# Patient Record
Sex: Male | Born: 1935 | Race: Black or African American | Hispanic: No | Marital: Married
Health system: Southern US, Community
[De-identification: ages and names within clinical notes are randomized; demographics above are authoritative.]

---

## 2004-10-19 ENCOUNTER — Emergency Department: Payer: Self-pay | Admitting: Internal Medicine

## 2004-11-14 ENCOUNTER — Emergency Department: Payer: Self-pay | Admitting: General Practice

## 2006-09-07 ENCOUNTER — Emergency Department: Payer: Self-pay | Admitting: Emergency Medicine

## 2007-05-30 ENCOUNTER — Emergency Department: Payer: Self-pay | Admitting: Emergency Medicine

## 2007-08-20 ENCOUNTER — Other Ambulatory Visit: Payer: Self-pay

## 2007-08-20 ENCOUNTER — Inpatient Hospital Stay: Payer: Self-pay | Admitting: Internal Medicine

## 2007-08-31 ENCOUNTER — Ambulatory Visit: Payer: Self-pay | Admitting: Internal Medicine

## 2012-04-03 ENCOUNTER — Emergency Department: Payer: Self-pay | Admitting: Unknown Physician Specialty

## 2012-04-03 LAB — COMPREHENSIVE METABOLIC PANEL
Albumin: 3.7 g/dL (ref 3.4–5.0)
Anion Gap: 8 (ref 7–16)
BUN: 16 mg/dL (ref 7–18)
Bilirubin,Total: 0.3 mg/dL (ref 0.2–1.0)
Co2: 28 mmol/L (ref 21–32)
Creatinine: 1.25 mg/dL (ref 0.60–1.30)
Potassium: 4.8 mmol/L (ref 3.5–5.1)
SGOT(AST): 31 U/L (ref 15–37)
SGPT (ALT): 48 U/L (ref 12–78)
Sodium: 142 mmol/L (ref 136–145)
Total Protein: 7.5 g/dL (ref 6.4–8.2)

## 2012-04-03 LAB — URINALYSIS, COMPLETE
Bilirubin,UR: NEGATIVE
Ketone: NEGATIVE
Ph: 6 (ref 4.5–8.0)
Protein: 150
RBC,UR: 170 /HPF (ref 0–5)
Specific Gravity: 1.025 (ref 1.003–1.030)
Squamous Epithelial: 5

## 2012-04-03 LAB — CBC
HCT: 37.2 % — ABNORMAL LOW (ref 40.0–52.0)
HGB: 12.3 g/dL — ABNORMAL LOW (ref 13.0–18.0)
MCHC: 32.9 g/dL (ref 32.0–36.0)
Platelet: 125 10*3/uL — ABNORMAL LOW (ref 150–440)
RDW: 17.1 % — ABNORMAL HIGH (ref 11.5–14.5)

## 2012-04-08 ENCOUNTER — Emergency Department: Payer: Self-pay | Admitting: Emergency Medicine

## 2012-04-08 LAB — URINALYSIS, COMPLETE
Ketone: NEGATIVE
Leukocyte Esterase: NEGATIVE
Nitrite: NEGATIVE
Ph: 5 (ref 4.5–8.0)
Protein: 30
RBC,UR: 1 /HPF (ref 0–5)
Squamous Epithelial: NONE SEEN
WBC UR: 1 /HPF (ref 0–5)

## 2013-07-23 ENCOUNTER — Emergency Department: Payer: Self-pay | Admitting: Emergency Medicine

## 2013-07-23 LAB — CBC
HCT: 36.9 % — ABNORMAL LOW
HGB: 11.9 g/dL — ABNORMAL LOW
MCH: 25.1 pg — ABNORMAL LOW
MCHC: 32.2 g/dL
MCV: 78 fL — ABNORMAL LOW
Platelet: 133 10*3/uL — ABNORMAL LOW
RBC: 4.73 x10 6/mm 3
RDW: 16.1 % — ABNORMAL HIGH
WBC: 5.5 10*3/uL

## 2013-07-23 LAB — COMPREHENSIVE METABOLIC PANEL WITH GFR
Albumin: 3.6 g/dL
Alkaline Phosphatase: 74 U/L
Anion Gap: 2 — ABNORMAL LOW
BUN: 17 mg/dL
Bilirubin,Total: 0.4 mg/dL
Calcium, Total: 8.9 mg/dL
Chloride: 108 mmol/L — ABNORMAL HIGH
Co2: 30 mmol/L
Creatinine: 1.21 mg/dL
EGFR (African American): >60
EGFR (Non-African Amer.): 55 — ABNORMAL LOW
Glucose: 179 mg/dL — ABNORMAL HIGH
Osmolality: 285
Potassium: 4.2 mmol/L
SGOT(AST): 22 U/L
SGPT (ALT): 30 U/L
Sodium: 140 mmol/L
Total Protein: 7.4 g/dL

## 2013-07-23 LAB — TROPONIN I: Troponin-I: <0.02 ng/mL

## 2013-08-07 ENCOUNTER — Ambulatory Visit: Payer: Self-pay | Admitting: Family Medicine

## 2013-08-13 ENCOUNTER — Inpatient Hospital Stay: Payer: Self-pay | Admitting: Family Medicine

## 2013-08-13 LAB — COMPREHENSIVE METABOLIC PANEL
ALT: 37 U/L (ref 12–78)
AST: 28 U/L (ref 15–37)
Albumin: 3.5 g/dL (ref 3.4–5.0)
Alkaline Phosphatase: 77 U/L
Anion Gap: 10 (ref 7–16)
BUN: 20 mg/dL — ABNORMAL HIGH (ref 7–18)
Bilirubin,Total: 0.3 mg/dL (ref 0.2–1.0)
CO2: 20 mmol/L — AB (ref 21–32)
Calcium, Total: 9.2 mg/dL (ref 8.5–10.1)
Chloride: 107 mmol/L (ref 98–107)
Creatinine: 1.14 mg/dL (ref 0.60–1.30)
EGFR (African American): >60
EGFR (Non-African Amer.): 59 — ABNORMAL LOW
Glucose: 184 mg/dL — ABNORMAL HIGH (ref 65–99)
OSMOLALITY: 281 (ref 275–301)
POTASSIUM: 3.9 mmol/L (ref 3.5–5.1)
Sodium: 137 mmol/L (ref 136–145)
TOTAL PROTEIN: 7.7 g/dL (ref 6.4–8.2)

## 2013-08-13 LAB — URINALYSIS, COMPLETE
BACTERIA: NONE SEEN
BILIRUBIN, UR: NEGATIVE
BLOOD: NEGATIVE
Hyaline Cast: 1
LEUKOCYTE ESTERASE: NEGATIVE
Nitrite: NEGATIVE
Ph: 5 (ref 4.5–8.0)
Protein: 100
Specific Gravity: 1.028 (ref 1.003–1.030)
WBC UR: 1 /HPF (ref 0–5)

## 2013-08-13 LAB — DRUG SCREEN, URINE

## 2013-08-13 LAB — CBC WITH DIFFERENTIAL/PLATELET
Basophil #: 0 10*3/uL (ref 0.0–0.1)
Basophil %: 0.4 %
EOS PCT: 0.2 %
Eosinophil #: 0 10*3/uL (ref 0.0–0.7)
HCT: 36.4 % — AB (ref 40.0–52.0)
HGB: 11.4 g/dL — ABNORMAL LOW (ref 13.0–18.0)
Lymphocyte #: 1 10*3/uL (ref 1.0–3.6)
Lymphocyte %: 12.2 %
MCH: 24.4 pg — AB (ref 26.0–34.0)
MCHC: 31.3 g/dL — AB (ref 32.0–36.0)
MCV: 78 fL — ABNORMAL LOW (ref 80–100)
MONO ABS: 0.4 x10 3/mm (ref 0.2–1.0)
MONOS PCT: 4.4 %
NEUTROS ABS: 6.7 10*3/uL — AB (ref 1.4–6.5)
NEUTROS PCT: 82.8 %
Platelet: 156 10*3/uL (ref 150–440)
RBC: 4.66 10*6/uL (ref 4.40–5.90)
RDW: 16.3 % — ABNORMAL HIGH (ref 11.5–14.5)
WBC: 8.1 10*3/uL (ref 3.8–10.6)

## 2013-08-13 LAB — PROTIME-INR
INR: 1.1
Prothrombin Time: 13.8 secs (ref 11.5–14.7)

## 2013-08-13 LAB — APTT: ACTIVATED PTT: 28 s (ref 23.6–35.9)

## 2013-08-13 LAB — ETHANOL
Ethanol %: <0.003 % (ref 0.000–0.080)
Ethanol: <3 mg/dL

## 2013-08-13 LAB — TROPONIN I: Troponin-I: <0.02 ng/mL

## 2013-08-14 ENCOUNTER — Ambulatory Visit: Payer: Self-pay | Admitting: Oncology

## 2013-08-14 LAB — LIPID PANEL
Cholesterol: 163 mg/dL (ref 0–200)
HDL Cholesterol: 49 mg/dL (ref 40–60)
Ldl Cholesterol, Calc: 87 mg/dL (ref 0–100)
TRIGLYCERIDES: 134 mg/dL (ref 0–200)
VLDL Cholesterol, Calc: 27 mg/dL (ref 5–40)

## 2013-08-15 LAB — CBC WITH DIFFERENTIAL/PLATELET
Basophil #: 0 10*3/uL (ref 0.0–0.1)
Basophil %: 0.5 %
EOS ABS: 0.1 10*3/uL (ref 0.0–0.7)
EOS PCT: 1.4 %
HCT: 36.9 % — ABNORMAL LOW (ref 40.0–52.0)
HGB: 11.8 g/dL — ABNORMAL LOW (ref 13.0–18.0)
Lymphocyte #: 1.4 10*3/uL (ref 1.0–3.6)
Lymphocyte %: 19.2 %
MCH: 24.7 pg — AB (ref 26.0–34.0)
MCHC: 32.1 g/dL (ref 32.0–36.0)
MCV: 77 fL — ABNORMAL LOW (ref 80–100)
MONO ABS: 0.5 x10 3/mm (ref 0.2–1.0)
Monocyte %: 6.9 %
NEUTROS ABS: 5.4 10*3/uL (ref 1.4–6.5)
NEUTROS PCT: 72 %
Platelet: 157 10*3/uL (ref 150–440)
RBC: 4.78 10*6/uL (ref 4.40–5.90)
RDW: 16.7 % — ABNORMAL HIGH (ref 11.5–14.5)
WBC: 7.5 10*3/uL (ref 3.8–10.6)

## 2013-08-15 LAB — BASIC METABOLIC PANEL
ANION GAP: 5 — AB (ref 7–16)
BUN: 18 mg/dL (ref 7–18)
CHLORIDE: 106 mmol/L (ref 98–107)
CO2: 28 mmol/L (ref 21–32)
CREATININE: 1.12 mg/dL (ref 0.60–1.30)
Calcium, Total: 8.8 mg/dL (ref 8.5–10.1)
EGFR (African American): >60
Glucose: 160 mg/dL — ABNORMAL HIGH (ref 65–99)
Osmolality: 283 (ref 275–301)
POTASSIUM: 3.3 mmol/L — AB (ref 3.5–5.1)
Sodium: 139 mmol/L (ref 136–145)

## 2013-08-15 LAB — MAGNESIUM: MAGNESIUM: 1.7 mg/dL — AB

## 2013-08-16 ENCOUNTER — Ambulatory Visit: Payer: Self-pay | Admitting: Oncology

## 2013-08-20 ENCOUNTER — Emergency Department: Payer: Self-pay | Admitting: Emergency Medicine

## 2013-08-20 LAB — URINALYSIS, COMPLETE
BILIRUBIN, UR: NEGATIVE
Bacteria: NONE SEEN
Glucose,UR: >=500 mg/dL (ref 0–75)
Ketone: NEGATIVE
Leukocyte Esterase: NEGATIVE
Nitrite: NEGATIVE
PH: 5 (ref 4.5–8.0)
RBC,UR: 6 /HPF (ref 0–5)
Specific Gravity: 1.02 (ref 1.003–1.030)
Squamous Epithelial: <1
WBC UR: 3 /HPF (ref 0–5)

## 2013-08-20 LAB — CBC WITH DIFFERENTIAL/PLATELET
BASOS ABS: 0 10*3/uL (ref 0.0–0.1)
Basophil %: 0.6 %
Eosinophil #: 0.2 10*3/uL (ref 0.0–0.7)
Eosinophil %: 2.5 %
HCT: 39.3 % — ABNORMAL LOW (ref 40.0–52.0)
HGB: 12.8 g/dL — AB (ref 13.0–18.0)
LYMPHS ABS: 1.5 10*3/uL (ref 1.0–3.6)
Lymphocyte %: 23 %
MCH: 25.3 pg — ABNORMAL LOW (ref 26.0–34.0)
MCHC: 32.5 g/dL (ref 32.0–36.0)
MCV: 78 fL — AB (ref 80–100)
Monocyte #: 0.5 x10 3/mm (ref 0.2–1.0)
Monocyte %: 7.5 %
NEUTROS ABS: 4.4 10*3/uL (ref 1.4–6.5)
Neutrophil %: 66.4 %
Platelet: 156 10*3/uL (ref 150–440)
RBC: 5.05 10*6/uL (ref 4.40–5.90)
RDW: 16.8 % — ABNORMAL HIGH (ref 11.5–14.5)
WBC: 6.6 10*3/uL (ref 3.8–10.6)

## 2013-08-20 LAB — COMPREHENSIVE METABOLIC PANEL
ALK PHOS: 87 U/L
ANION GAP: 6 — AB (ref 7–16)
AST: 39 U/L — AB (ref 15–37)
Albumin: 3.8 g/dL (ref 3.4–5.0)
BUN: 17 mg/dL (ref 7–18)
Bilirubin,Total: 0.3 mg/dL (ref 0.2–1.0)
CHLORIDE: 103 mmol/L (ref 98–107)
CO2: 27 mmol/L (ref 21–32)
Calcium, Total: 9.4 mg/dL (ref 8.5–10.1)
Creatinine: 1.22 mg/dL (ref 0.60–1.30)
EGFR (African American): >60
EGFR (Non-African Amer.): 54 — ABNORMAL LOW
Glucose: 248 mg/dL — ABNORMAL HIGH (ref 65–99)
Osmolality: 282 (ref 275–301)
Potassium: 4.9 mmol/L (ref 3.5–5.1)
SGPT (ALT): 69 U/L (ref 12–78)
Sodium: 136 mmol/L (ref 136–145)
Total Protein: 8.2 g/dL (ref 6.4–8.2)

## 2013-08-22 ENCOUNTER — Ambulatory Visit: Payer: Self-pay | Admitting: Internal Medicine

## 2013-08-27 ENCOUNTER — Ambulatory Visit: Payer: Self-pay | Admitting: Oncology

## 2013-08-30 ENCOUNTER — Emergency Department: Payer: Self-pay | Admitting: Emergency Medicine

## 2013-08-31 ENCOUNTER — Ambulatory Visit: Payer: Self-pay | Admitting: Oncology

## 2013-09-12 ENCOUNTER — Ambulatory Visit: Payer: Self-pay | Admitting: Oncology

## 2013-09-12 LAB — CBC WITH DIFFERENTIAL/PLATELET
BASOS PCT: 0.6 %
Basophil #: 0 10*3/uL (ref 0.0–0.1)
EOS ABS: 0.1 10*3/uL (ref 0.0–0.7)
Eosinophil %: 1.6 %
HCT: 37.9 % — ABNORMAL LOW (ref 40.0–52.0)
HGB: 12.1 g/dL — ABNORMAL LOW (ref 13.0–18.0)
LYMPHS PCT: 26.6 %
Lymphocyte #: 1.6 10*3/uL (ref 1.0–3.6)
MCH: 25.1 pg — ABNORMAL LOW (ref 26.0–34.0)
MCHC: 32 g/dL (ref 32.0–36.0)
MCV: 78 fL — AB (ref 80–100)
Monocyte #: 0.5 x10 3/mm (ref 0.2–1.0)
Monocyte %: 8.1 %
Neutrophil #: 3.7 10*3/uL (ref 1.4–6.5)
Neutrophil %: 63.1 %
Platelet: 154 10*3/uL (ref 150–440)
RBC: 4.84 10*6/uL (ref 4.40–5.90)
RDW: 17.2 % — ABNORMAL HIGH (ref 11.5–14.5)
WBC: 5.9 10*3/uL (ref 3.8–10.6)

## 2013-09-12 LAB — PROTIME-INR
INR: 1
Prothrombin Time: 12.9 secs (ref 11.5–14.7)

## 2013-09-12 LAB — APTT: Activated PTT: 31.9 secs (ref 23.6–35.9)

## 2013-09-14 LAB — PATHOLOGY REPORT

## 2013-09-27 ENCOUNTER — Ambulatory Visit: Payer: Self-pay | Admitting: Oncology

## 2013-09-28 ENCOUNTER — Ambulatory Visit: Payer: Self-pay | Admitting: Cardiothoracic Surgery

## 2013-09-29 ENCOUNTER — Ambulatory Visit: Payer: Self-pay | Admitting: Oncology

## 2013-10-27 ENCOUNTER — Ambulatory Visit: Payer: Self-pay | Admitting: Oncology

## 2013-11-14 ENCOUNTER — Inpatient Hospital Stay: Payer: Self-pay | Admitting: Internal Medicine

## 2013-11-15 LAB — BASIC METABOLIC PANEL
Anion Gap: 7 (ref 7–16)
BUN: 17 mg/dL (ref 7–18)
Calcium, Total: 8.4 mg/dL — ABNORMAL LOW (ref 8.5–10.1)
Chloride: 108 mmol/L — ABNORMAL HIGH (ref 98–107)
Co2: 23 mmol/L (ref 21–32)
Creatinine: 1.3 mg/dL (ref 0.60–1.30)
EGFR (African American): 58 — ABNORMAL LOW
GFR CALC NON AF AMER: 50 — AB
GLUCOSE: 141 mg/dL — AB (ref 65–99)
Osmolality: 280 (ref 275–301)
POTASSIUM: 4.1 mmol/L (ref 3.5–5.1)
SODIUM: 138 mmol/L (ref 136–145)

## 2013-11-15 LAB — CBC WITH DIFFERENTIAL/PLATELET
BASOS ABS: 0 10*3/uL (ref 0.0–0.1)
BASOS PCT: 0.5 %
Eosinophil #: 0.1 10*3/uL (ref 0.0–0.7)
Eosinophil %: 1.8 %
HCT: 35.2 % — ABNORMAL LOW (ref 40.0–52.0)
HGB: 11.2 g/dL — ABNORMAL LOW (ref 13.0–18.0)
LYMPHS ABS: 1.7 10*3/uL (ref 1.0–3.6)
Lymphocyte %: 31 %
MCH: 24.7 pg — ABNORMAL LOW (ref 26.0–34.0)
MCHC: 31.8 g/dL — AB (ref 32.0–36.0)
MCV: 78 fL — ABNORMAL LOW (ref 80–100)
MONO ABS: 0.4 x10 3/mm (ref 0.2–1.0)
Monocyte %: 7.6 %
NEUTROS ABS: 3.2 10*3/uL (ref 1.4–6.5)
NEUTROS PCT: 59.1 %
Platelet: 133 10*3/uL — ABNORMAL LOW (ref 150–440)
RBC: 4.54 10*6/uL (ref 4.40–5.90)
RDW: 17.9 % — ABNORMAL HIGH (ref 11.5–14.5)
WBC: 5.4 10*3/uL (ref 3.8–10.6)

## 2013-11-15 LAB — PATHOLOGY REPORT

## 2013-11-23 ENCOUNTER — Ambulatory Visit: Payer: Self-pay | Admitting: Oncology

## 2013-11-23 LAB — CBC CANCER CENTER
BASOS ABS: 0.1 x10 3/mm (ref 0.0–0.1)
Basophil %: 0.9 %
EOS ABS: 0.2 x10 3/mm (ref 0.0–0.7)
Eosinophil %: 3.1 %
HCT: 39.3 % — ABNORMAL LOW (ref 40.0–52.0)
HGB: 12.4 g/dL — ABNORMAL LOW (ref 13.0–18.0)
LYMPHS ABS: 2 x10 3/mm (ref 1.0–3.6)
Lymphocyte %: 34.2 %
MCH: 24.7 pg — ABNORMAL LOW (ref 26.0–34.0)
MCHC: 31.5 g/dL — ABNORMAL LOW (ref 32.0–36.0)
MCV: 78 fL — AB (ref 80–100)
MONOS PCT: 8.2 %
Monocyte #: 0.5 x10 3/mm (ref 0.2–1.0)
NEUTROS ABS: 3.1 x10 3/mm (ref 1.4–6.5)
Neutrophil %: 53.6 %
Platelet: 182 x10 3/mm (ref 150–440)
RBC: 5.01 10*6/uL (ref 4.40–5.90)
RDW: 17.4 % — AB (ref 11.5–14.5)
WBC: 5.8 x10 3/mm (ref 3.8–10.6)

## 2013-11-23 LAB — COMPREHENSIVE METABOLIC PANEL
ALBUMIN: 3.7 g/dL (ref 3.4–5.0)
ALK PHOS: 79 U/L
ANION GAP: 9 (ref 7–16)
AST: 17 U/L (ref 15–37)
BUN: 18 mg/dL (ref 7–18)
Bilirubin,Total: 0.3 mg/dL (ref 0.2–1.0)
CALCIUM: 8.8 mg/dL (ref 8.5–10.1)
CHLORIDE: 106 mmol/L (ref 98–107)
CREATININE: 1.3 mg/dL (ref 0.60–1.30)
Co2: 27 mmol/L (ref 21–32)
EGFR (African American): 58 — ABNORMAL LOW
GFR CALC NON AF AMER: 50 — AB
Glucose: 148 mg/dL — ABNORMAL HIGH (ref 65–99)
Osmolality: 288 (ref 275–301)
Potassium: 4.7 mmol/L (ref 3.5–5.1)
SGPT (ALT): 34 U/L (ref 12–78)
SODIUM: 142 mmol/L (ref 136–145)
Total Protein: 8 g/dL (ref 6.4–8.2)

## 2013-11-23 LAB — APTT: Activated PTT: 31.3 secs (ref 23.6–35.9)

## 2013-11-23 LAB — PROTIME-INR
INR: 1.1
Prothrombin Time: 14 secs (ref 11.5–14.7)

## 2013-11-27 ENCOUNTER — Ambulatory Visit: Payer: Self-pay | Admitting: Oncology

## 2013-11-29 ENCOUNTER — Ambulatory Visit: Payer: Self-pay

## 2013-12-04 LAB — CBC CANCER CENTER
BASOS ABS: 0.1 x10 3/mm (ref 0.0–0.1)
Basophil %: 1.1 %
EOS PCT: 3.7 %
Eosinophil #: 0.2 x10 3/mm (ref 0.0–0.7)
HCT: 39 % — AB (ref 40.0–52.0)
HGB: 12.1 g/dL — AB (ref 13.0–18.0)
LYMPHS ABS: 1.5 x10 3/mm (ref 1.0–3.6)
Lymphocyte %: 30.2 %
MCH: 24.6 pg — ABNORMAL LOW (ref 26.0–34.0)
MCHC: 31.1 g/dL — ABNORMAL LOW (ref 32.0–36.0)
MCV: 79 fL — ABNORMAL LOW (ref 80–100)
MONO ABS: 0.4 x10 3/mm (ref 0.2–1.0)
Monocyte %: 7.1 %
Neutrophil #: 2.9 x10 3/mm (ref 1.4–6.5)
Neutrophil %: 57.9 %
Platelet: 159 x10 3/mm (ref 150–440)
RBC: 4.93 10*6/uL (ref 4.40–5.90)
RDW: 17.7 % — ABNORMAL HIGH (ref 11.5–14.5)
WBC: 5.1 x10 3/mm (ref 3.8–10.6)

## 2013-12-04 LAB — COMPREHENSIVE METABOLIC PANEL
Albumin: 3.5 g/dL (ref 3.4–5.0)
Alkaline Phosphatase: 81 U/L
Anion Gap: 9 (ref 7–16)
BILIRUBIN TOTAL: 0.3 mg/dL (ref 0.2–1.0)
BUN: 12 mg/dL (ref 7–18)
CHLORIDE: 107 mmol/L (ref 98–107)
CREATININE: 1.29 mg/dL (ref 0.60–1.30)
Calcium, Total: 9 mg/dL (ref 8.5–10.1)
Co2: 26 mmol/L (ref 21–32)
EGFR (African American): 59 — ABNORMAL LOW
EGFR (Non-African Amer.): 51 — ABNORMAL LOW
Glucose: 226 mg/dL — ABNORMAL HIGH (ref 65–99)
Osmolality: 290 (ref 275–301)
Potassium: 4.7 mmol/L (ref 3.5–5.1)
SGOT(AST): 15 U/L (ref 15–37)
SGPT (ALT): 31 U/L (ref 12–78)
SODIUM: 142 mmol/L (ref 136–145)
Total Protein: 7.8 g/dL (ref 6.4–8.2)

## 2013-12-11 LAB — CBC CANCER CENTER
BASOS ABS: 0 x10 3/mm (ref 0.0–0.1)
BASOS PCT: 1.1 %
Eosinophil #: 0.2 x10 3/mm (ref 0.0–0.7)
Eosinophil %: 3.8 %
HCT: 34.2 % — AB (ref 40.0–52.0)
HGB: 11 g/dL — AB (ref 13.0–18.0)
LYMPHS ABS: 1.7 x10 3/mm (ref 1.0–3.6)
Lymphocyte %: 40.6 %
MCH: 25 pg — ABNORMAL LOW (ref 26.0–34.0)
MCHC: 32.1 g/dL (ref 32.0–36.0)
MCV: 78 fL — ABNORMAL LOW (ref 80–100)
MONO ABS: 0.2 x10 3/mm (ref 0.2–1.0)
MONOS PCT: 3.9 %
Neutrophil #: 2.2 x10 3/mm (ref 1.4–6.5)
Neutrophil %: 50.6 %
PLATELETS: 135 x10 3/mm — AB (ref 150–440)
RBC: 4.4 10*6/uL (ref 4.40–5.90)
RDW: 17.2 % — ABNORMAL HIGH (ref 11.5–14.5)
WBC: 4.3 x10 3/mm (ref 3.8–10.6)

## 2013-12-11 LAB — COMPREHENSIVE METABOLIC PANEL
ALK PHOS: 74 U/L
Albumin: 3.3 g/dL — ABNORMAL LOW (ref 3.4–5.0)
Anion Gap: 8 (ref 7–16)
BUN: 17 mg/dL (ref 7–18)
Bilirubin,Total: 0.3 mg/dL (ref 0.2–1.0)
CALCIUM: 9 mg/dL (ref 8.5–10.1)
Chloride: 104 mmol/L (ref 98–107)
Co2: 28 mmol/L (ref 21–32)
Creatinine: 1.11 mg/dL (ref 0.60–1.30)
EGFR (Non-African Amer.): >60
Glucose: 116 mg/dL — ABNORMAL HIGH (ref 65–99)
OSMOLALITY: 282 (ref 275–301)
Potassium: 4.3 mmol/L (ref 3.5–5.1)
SGOT(AST): 13 U/L — ABNORMAL LOW (ref 15–37)
SGPT (ALT): 28 U/L (ref 12–78)
SODIUM: 140 mmol/L (ref 136–145)
Total Protein: 7.1 g/dL (ref 6.4–8.2)

## 2013-12-19 LAB — COMPREHENSIVE METABOLIC PANEL
ANION GAP: 8 (ref 7–16)
Albumin: 3.3 g/dL — ABNORMAL LOW (ref 3.4–5.0)
Alkaline Phosphatase: 75 U/L
BUN: 21 mg/dL — AB (ref 7–18)
Bilirubin,Total: 0.2 mg/dL (ref 0.2–1.0)
CREATININE: 1.4 mg/dL — AB (ref 0.60–1.30)
Calcium, Total: 8.5 mg/dL (ref 8.5–10.1)
Chloride: 106 mmol/L (ref 98–107)
Co2: 27 mmol/L (ref 21–32)
EGFR (African American): 53 — ABNORMAL LOW
EGFR (Non-African Amer.): 46 — ABNORMAL LOW
Glucose: 156 mg/dL — ABNORMAL HIGH (ref 65–99)
OSMOLALITY: 287 (ref 275–301)
POTASSIUM: 4.4 mmol/L (ref 3.5–5.1)
SGOT(AST): 14 U/L — ABNORMAL LOW (ref 15–37)
SGPT (ALT): 32 U/L (ref 12–78)
SODIUM: 141 mmol/L (ref 136–145)
TOTAL PROTEIN: 6.9 g/dL (ref 6.4–8.2)

## 2013-12-19 LAB — CBC CANCER CENTER
BASOS PCT: 0.9 %
Basophil #: 0 x10 3/mm (ref 0.0–0.1)
Eosinophil #: 0 x10 3/mm (ref 0.0–0.7)
Eosinophil %: 1.1 %
HCT: 33.5 % — AB (ref 40.0–52.0)
HGB: 10.8 g/dL — ABNORMAL LOW (ref 13.0–18.0)
Lymphocyte #: 1.2 x10 3/mm (ref 1.0–3.6)
Lymphocyte %: 39.4 %
MCH: 25.1 pg — ABNORMAL LOW (ref 26.0–34.0)
MCHC: 32.1 g/dL (ref 32.0–36.0)
MCV: 78 fL — ABNORMAL LOW (ref 80–100)
Monocyte #: 0.2 x10 3/mm (ref 0.2–1.0)
Monocyte %: 5.8 %
Neutrophil #: 1.6 x10 3/mm (ref 1.4–6.5)
Neutrophil %: 52.8 %
Platelet: 161 x10 3/mm (ref 150–440)
RBC: 4.28 10*6/uL — AB (ref 4.40–5.90)
RDW: 17 % — AB (ref 11.5–14.5)
WBC: 3 x10 3/mm — ABNORMAL LOW (ref 3.8–10.6)

## 2013-12-26 LAB — CBC CANCER CENTER
BASOS ABS: 0 x10 3/mm (ref 0.0–0.1)
Basophil %: 1.4 %
EOS PCT: 1 %
Eosinophil #: 0 x10 3/mm (ref 0.0–0.7)
HCT: 33.4 % — AB (ref 40.0–52.0)
HGB: 10.7 g/dL — ABNORMAL LOW (ref 13.0–18.0)
Lymphocyte #: 1.1 x10 3/mm (ref 1.0–3.6)
Lymphocyte %: 40.7 %
MCH: 25 pg — ABNORMAL LOW (ref 26.0–34.0)
MCHC: 32.1 g/dL (ref 32.0–36.0)
MCV: 78 fL — AB (ref 80–100)
MONO ABS: 0.2 x10 3/mm (ref 0.2–1.0)
Monocyte %: 5.9 %
Neutrophil #: 1.4 x10 3/mm (ref 1.4–6.5)
Neutrophil %: 51 %
Platelet: 158 x10 3/mm (ref 150–440)
RBC: 4.28 10*6/uL — ABNORMAL LOW (ref 4.40–5.90)
RDW: 17.1 % — ABNORMAL HIGH (ref 11.5–14.5)
WBC: 2.7 x10 3/mm — ABNORMAL LOW (ref 3.8–10.6)

## 2013-12-26 LAB — COMPREHENSIVE METABOLIC PANEL
ALBUMIN: 3.2 g/dL — AB (ref 3.4–5.0)
AST: 15 U/L (ref 15–37)
Alkaline Phosphatase: 76 U/L
Anion Gap: 8 (ref 7–16)
BUN: 20 mg/dL — AB (ref 7–18)
Bilirubin,Total: 0.2 mg/dL (ref 0.2–1.0)
CHLORIDE: 106 mmol/L (ref 98–107)
Calcium, Total: 8.3 mg/dL — ABNORMAL LOW (ref 8.5–10.1)
Co2: 25 mmol/L (ref 21–32)
Creatinine: 1.36 mg/dL — ABNORMAL HIGH (ref 0.60–1.30)
EGFR (African American): 55 — ABNORMAL LOW
GFR CALC NON AF AMER: 47 — AB
Glucose: 180 mg/dL — ABNORMAL HIGH (ref 65–99)
OSMOLALITY: 285 (ref 275–301)
POTASSIUM: 4.7 mmol/L (ref 3.5–5.1)
SGPT (ALT): 30 U/L (ref 12–78)
Sodium: 139 mmol/L (ref 136–145)
TOTAL PROTEIN: 6.9 g/dL (ref 6.4–8.2)

## 2013-12-27 ENCOUNTER — Ambulatory Visit: Payer: Self-pay | Admitting: Oncology

## 2014-01-02 LAB — CBC CANCER CENTER
BASOS ABS: 0 x10 3/mm (ref 0.0–0.1)
Basophil %: 0.7 %
EOS ABS: 0 x10 3/mm (ref 0.0–0.7)
Eosinophil %: 1.1 %
HCT: 32.4 % — ABNORMAL LOW (ref 40.0–52.0)
HGB: 10.5 g/dL — ABNORMAL LOW (ref 13.0–18.0)
LYMPHS PCT: 31.4 %
Lymphocyte #: 0.9 x10 3/mm — ABNORMAL LOW (ref 1.0–3.6)
MCH: 25.4 pg — ABNORMAL LOW (ref 26.0–34.0)
MCHC: 32.3 g/dL (ref 32.0–36.0)
MCV: 79 fL — AB (ref 80–100)
Monocyte #: 0.2 x10 3/mm (ref 0.2–1.0)
Monocyte %: 7.9 %
NEUTROS PCT: 58.9 %
Neutrophil #: 1.7 x10 3/mm (ref 1.4–6.5)
PLATELETS: 141 x10 3/mm — AB (ref 150–440)
RBC: 4.13 10*6/uL — ABNORMAL LOW (ref 4.40–5.90)
RDW: 17.3 % — AB (ref 11.5–14.5)
WBC: 3 x10 3/mm — ABNORMAL LOW (ref 3.8–10.6)

## 2014-01-02 LAB — COMPREHENSIVE METABOLIC PANEL
ALBUMIN: 3.3 g/dL — AB (ref 3.4–5.0)
AST: 17 U/L (ref 15–37)
Alkaline Phosphatase: 78 U/L
Anion Gap: 8 (ref 7–16)
BUN: 12 mg/dL (ref 7–18)
Bilirubin,Total: 0.2 mg/dL (ref 0.2–1.0)
CO2: 27 mmol/L (ref 21–32)
Calcium, Total: 8.4 mg/dL — ABNORMAL LOW (ref 8.5–10.1)
Chloride: 105 mmol/L (ref 98–107)
Creatinine: 1.24 mg/dL (ref 0.60–1.30)
EGFR (Non-African Amer.): 53 — ABNORMAL LOW
Glucose: 246 mg/dL — ABNORMAL HIGH (ref 65–99)
OSMOLALITY: 287 (ref 275–301)
POTASSIUM: 4.4 mmol/L (ref 3.5–5.1)
SGPT (ALT): 35 U/L (ref 12–78)
Sodium: 140 mmol/L (ref 136–145)
Total Protein: 6.9 g/dL (ref 6.4–8.2)

## 2014-01-09 LAB — CBC CANCER CENTER
Basophil #: 0 x10 3/mm (ref 0.0–0.1)
Basophil %: 0.9 %
Eosinophil #: 0 x10 3/mm (ref 0.0–0.7)
Eosinophil %: 0.9 %
HCT: 32.9 % — ABNORMAL LOW (ref 40.0–52.0)
HGB: 10.5 g/dL — ABNORMAL LOW (ref 13.0–18.0)
LYMPHS ABS: 0.6 x10 3/mm — AB (ref 1.0–3.6)
Lymphocyte %: 21.8 %
MCH: 25.2 pg — AB (ref 26.0–34.0)
MCHC: 32 g/dL (ref 32.0–36.0)
MCV: 79 fL — ABNORMAL LOW (ref 80–100)
Monocyte #: 0.2 x10 3/mm (ref 0.2–1.0)
Monocyte %: 6 %
NEUTROS ABS: 2.1 x10 3/mm (ref 1.4–6.5)
Neutrophil %: 70.4 %
PLATELETS: 127 x10 3/mm — AB (ref 150–440)
RBC: 4.17 10*6/uL — ABNORMAL LOW (ref 4.40–5.90)
RDW: 17.6 % — ABNORMAL HIGH (ref 11.5–14.5)
WBC: 3 x10 3/mm — ABNORMAL LOW (ref 3.8–10.6)

## 2014-01-09 LAB — COMPREHENSIVE METABOLIC PANEL
ALT: 33 U/L (ref 12–78)
ANION GAP: 11 (ref 7–16)
AST: 18 U/L (ref 15–37)
Albumin: 3.3 g/dL — ABNORMAL LOW (ref 3.4–5.0)
Alkaline Phosphatase: 81 U/L
BUN: 19 mg/dL — ABNORMAL HIGH (ref 7–18)
Bilirubin,Total: 0.1 mg/dL — ABNORMAL LOW (ref 0.2–1.0)
CO2: 24 mmol/L (ref 21–32)
CREATININE: 1.52 mg/dL — AB (ref 0.60–1.30)
Calcium, Total: 8.6 mg/dL (ref 8.5–10.1)
Chloride: 105 mmol/L (ref 98–107)
EGFR (African American): 48 — ABNORMAL LOW
GFR CALC NON AF AMER: 41 — AB
Glucose: 188 mg/dL — ABNORMAL HIGH (ref 65–99)
OSMOLALITY: 287 (ref 275–301)
POTASSIUM: 5 mmol/L (ref 3.5–5.1)
Sodium: 140 mmol/L (ref 136–145)
TOTAL PROTEIN: 7.1 g/dL (ref 6.4–8.2)

## 2014-01-27 ENCOUNTER — Ambulatory Visit: Payer: Self-pay | Admitting: Oncology

## 2014-02-27 ENCOUNTER — Ambulatory Visit: Payer: Self-pay | Admitting: Oncology

## 2014-03-29 ENCOUNTER — Ambulatory Visit: Payer: Self-pay | Admitting: Oncology

## 2014-04-11 ENCOUNTER — Ambulatory Visit: Payer: Self-pay | Admitting: Oncology

## 2014-04-29 ENCOUNTER — Emergency Department: Payer: Self-pay | Admitting: Emergency Medicine

## 2014-04-29 ENCOUNTER — Ambulatory Visit: Payer: Self-pay | Admitting: Oncology

## 2014-05-28 ENCOUNTER — Emergency Department: Payer: Self-pay | Admitting: Emergency Medicine

## 2014-05-28 LAB — COMPREHENSIVE METABOLIC PANEL
ALK PHOS: 89 U/L
ALT: 32 U/L
Albumin: 3 g/dL — ABNORMAL LOW (ref 3.4–5.0)
Anion Gap: 8 (ref 7–16)
BILIRUBIN TOTAL: 0.3 mg/dL (ref 0.2–1.0)
BUN: 14 mg/dL (ref 7–18)
CO2: 27 mmol/L (ref 21–32)
Calcium, Total: 8.5 mg/dL (ref 8.5–10.1)
Chloride: 105 mmol/L (ref 98–107)
Creatinine: 1.27 mg/dL (ref 0.60–1.30)
EGFR (African American): >60
EGFR (Non-African Amer.): 57 — ABNORMAL LOW
GLUCOSE: 144 mg/dL — AB (ref 65–99)
OSMOLALITY: 282 (ref 275–301)
POTASSIUM: 3.9 mmol/L (ref 3.5–5.1)
SGOT(AST): 24 U/L (ref 15–37)
Sodium: 140 mmol/L (ref 136–145)
Total Protein: 6.6 g/dL (ref 6.4–8.2)

## 2014-05-28 LAB — URINALYSIS, COMPLETE
BILIRUBIN, UR: NEGATIVE
Bacteria: NONE SEEN
Blood: NEGATIVE
KETONE: NEGATIVE
Leukocyte Esterase: NEGATIVE
Nitrite: NEGATIVE
Ph: 7 (ref 4.5–8.0)
SPECIFIC GRAVITY: 1.021 (ref 1.003–1.030)
Squamous Epithelial: <1

## 2014-05-28 LAB — CBC
HCT: 33.8 % — AB (ref 40.0–52.0)
HGB: 10.7 g/dL — AB (ref 13.0–18.0)
MCH: 24.8 pg — ABNORMAL LOW (ref 26.0–34.0)
MCHC: 31.7 g/dL — ABNORMAL LOW (ref 32.0–36.0)
MCV: 78 fL — ABNORMAL LOW (ref 80–100)
Platelet: 151 10*3/uL (ref 150–440)
RBC: 4.31 10*6/uL — ABNORMAL LOW (ref 4.40–5.90)
RDW: 17.4 % — ABNORMAL HIGH (ref 11.5–14.5)
WBC: 3.7 10*3/uL — AB (ref 3.8–10.6)

## 2014-05-28 LAB — TROPONIN I: Troponin-I: <0.02 ng/mL

## 2014-05-29 ENCOUNTER — Ambulatory Visit: Payer: Self-pay | Admitting: Oncology

## 2014-06-29 ENCOUNTER — Ambulatory Visit: Payer: Self-pay | Admitting: Oncology

## 2014-07-08 ENCOUNTER — Inpatient Hospital Stay: Payer: Self-pay | Admitting: Internal Medicine

## 2014-07-08 LAB — COMPREHENSIVE METABOLIC PANEL
ALBUMIN: 3.2 g/dL — AB (ref 3.4–5.0)
ALK PHOS: 70 U/L
ALT: 25 U/L
AST: 26 U/L (ref 15–37)
Anion Gap: 6 — ABNORMAL LOW (ref 7–16)
BUN: 22 mg/dL — AB (ref 7–18)
Bilirubin,Total: 0.3 mg/dL (ref 0.2–1.0)
CREATININE: 1.18 mg/dL (ref 0.60–1.30)
Calcium, Total: 8.5 mg/dL (ref 8.5–10.1)
Chloride: 105 mmol/L (ref 98–107)
Co2: 30 mmol/L (ref 21–32)
EGFR (African American): >60
GLUCOSE: 249 mg/dL — AB (ref 65–99)
OSMOLALITY: 293 (ref 275–301)
Potassium: 3.5 mmol/L (ref 3.5–5.1)
Sodium: 141 mmol/L (ref 136–145)
TOTAL PROTEIN: 6.4 g/dL (ref 6.4–8.2)

## 2014-07-08 LAB — CBC
HCT: 30.5 % — ABNORMAL LOW (ref 40.0–52.0)
HGB: 9.7 g/dL — ABNORMAL LOW (ref 13.0–18.0)
MCH: 24 pg — AB (ref 26.0–34.0)
MCHC: 31.7 g/dL — AB (ref 32.0–36.0)
MCV: 76 fL — AB (ref 80–100)
Platelet: 137 10*3/uL — ABNORMAL LOW (ref 150–440)
RBC: 4.03 10*6/uL — ABNORMAL LOW (ref 4.40–5.90)
RDW: 18.9 % — AB (ref 11.5–14.5)
WBC: 4.2 10*3/uL (ref 3.8–10.6)

## 2014-07-08 LAB — CK TOTAL AND CKMB (NOT AT ARMC)
CK, TOTAL: 83 U/L (ref 39–308)
CK-MB: 0.9 ng/mL (ref 0.5–3.6)

## 2014-07-08 LAB — URINALYSIS, COMPLETE
Bacteria: NONE SEEN
Bilirubin,UR: NEGATIVE
Ketone: NEGATIVE
Leukocyte Esterase: NEGATIVE
NITRITE: NEGATIVE
Ph: 8 (ref 4.5–8.0)
Specific Gravity: 1.022 (ref 1.003–1.030)
Squamous Epithelial: NONE SEEN
WBC UR: 9 /HPF (ref 0–5)

## 2014-07-08 LAB — TROPONIN I: Troponin-I: <0.02 ng/mL

## 2014-07-09 LAB — BASIC METABOLIC PANEL
Anion Gap: 8 (ref 7–16)
BUN: 16 mg/dL (ref 7–18)
CALCIUM: 8.2 mg/dL — AB (ref 8.5–10.1)
Chloride: 106 mmol/L (ref 98–107)
Co2: 27 mmol/L (ref 21–32)
Creatinine: 0.96 mg/dL (ref 0.60–1.30)
EGFR (African American): >60
EGFR (Non-African Amer.): >60
GLUCOSE: 97 mg/dL (ref 65–99)
OSMOLALITY: 282 (ref 275–301)
Potassium: 3.3 mmol/L — ABNORMAL LOW (ref 3.5–5.1)
Sodium: 141 mmol/L (ref 136–145)

## 2014-07-09 LAB — CBC WITH DIFFERENTIAL/PLATELET
BASOS ABS: 0 10*3/uL (ref 0.0–0.1)
Basophil %: 0.4 %
EOS ABS: 0.1 10*3/uL (ref 0.0–0.7)
Eosinophil %: 1.7 %
HCT: 27.4 % — AB (ref 40.0–52.0)
HGB: 8.7 g/dL — AB (ref 13.0–18.0)
LYMPHS ABS: 0.9 10*3/uL — AB (ref 1.0–3.6)
Lymphocyte %: 20.1 %
MCH: 24.2 pg — AB (ref 26.0–34.0)
MCHC: 31.7 g/dL — AB (ref 32.0–36.0)
MCV: 77 fL — AB (ref 80–100)
MONO ABS: 0.4 x10 3/mm (ref 0.2–1.0)
MONOS PCT: 8.9 %
NEUTROS ABS: 2.9 10*3/uL (ref 1.4–6.5)
Neutrophil %: 68.9 %
PLATELETS: 122 10*3/uL — AB (ref 150–440)
RBC: 3.57 10*6/uL — ABNORMAL LOW (ref 4.40–5.90)
RDW: 18.3 % — ABNORMAL HIGH (ref 11.5–14.5)
WBC: 4.3 10*3/uL (ref 3.8–10.6)

## 2014-07-09 LAB — OCCULT BLOOD X 1 CARD TO LAB, STOOL: OCCULT BLOOD, FECES: NEGATIVE

## 2014-07-10 LAB — CBC WITH DIFFERENTIAL/PLATELET
BASOS PCT: 0.6 %
Basophil #: 0 10*3/uL (ref 0.0–0.1)
EOS ABS: 0.1 10*3/uL (ref 0.0–0.7)
EOS PCT: 1.5 %
HCT: 29.5 % — AB (ref 40.0–52.0)
HGB: 9.4 g/dL — ABNORMAL LOW (ref 13.0–18.0)
LYMPHS ABS: 0.8 10*3/uL — AB (ref 1.0–3.6)
Lymphocyte %: 15.2 %
MCH: 24 pg — AB (ref 26.0–34.0)
MCHC: 31.9 g/dL — AB (ref 32.0–36.0)
MCV: 75 fL — AB (ref 80–100)
MONO ABS: 0.4 x10 3/mm (ref 0.2–1.0)
Monocyte %: 8.2 %
NEUTROS ABS: 4 10*3/uL (ref 1.4–6.5)
Neutrophil %: 74.5 %
Platelet: 134 10*3/uL — ABNORMAL LOW (ref 150–440)
RBC: 3.91 10*6/uL — ABNORMAL LOW (ref 4.40–5.90)
RDW: 18.3 % — ABNORMAL HIGH (ref 11.5–14.5)
WBC: 5.3 10*3/uL (ref 3.8–10.6)

## 2014-07-10 LAB — MAGNESIUM: Magnesium: 1.5 mg/dL — ABNORMAL LOW

## 2014-07-10 LAB — POTASSIUM: Potassium: 4.1 mmol/L (ref 3.5–5.1)

## 2014-07-12 LAB — URINE CULTURE

## 2014-07-13 ENCOUNTER — Emergency Department: Payer: Self-pay | Admitting: Emergency Medicine

## 2014-07-13 LAB — URINALYSIS, COMPLETE
Bacteria: NONE SEEN
Bilirubin,UR: NEGATIVE
Glucose,UR: 150 mg/dL (ref 0–75)
Ketone: NEGATIVE
LEUKOCYTE ESTERASE: NEGATIVE
NITRITE: NEGATIVE
PH: 6 (ref 4.5–8.0)
Protein: 30
RBC,UR: 156 /HPF (ref 0–5)
SPECIFIC GRAVITY: 1.017 (ref 1.003–1.030)
Squamous Epithelial: NONE SEEN
WBC UR: 3 /HPF (ref 0–5)

## 2014-07-13 LAB — COMPREHENSIVE METABOLIC PANEL
ALT: 25 U/L
Albumin: 3.2 g/dL — ABNORMAL LOW (ref 3.4–5.0)
Alkaline Phosphatase: 76 U/L
Anion Gap: 5 — ABNORMAL LOW (ref 7–16)
BILIRUBIN TOTAL: 0.5 mg/dL (ref 0.2–1.0)
BUN: 21 mg/dL — AB (ref 7–18)
CREATININE: 1.17 mg/dL (ref 0.60–1.30)
Calcium, Total: 9.5 mg/dL (ref 8.5–10.1)
Chloride: 105 mmol/L (ref 98–107)
Co2: 29 mmol/L (ref 21–32)
EGFR (African American): >60
Glucose: 156 mg/dL — ABNORMAL HIGH (ref 65–99)
Osmolality: 284 (ref 275–301)
Potassium: 5 mmol/L (ref 3.5–5.1)
SGOT(AST): 28 U/L (ref 15–37)
Sodium: 139 mmol/L (ref 136–145)
TOTAL PROTEIN: 6.9 g/dL (ref 6.4–8.2)

## 2014-07-13 LAB — CBC WITH DIFFERENTIAL/PLATELET
BASOS ABS: 0 10*3/uL (ref 0.0–0.1)
Basophil %: 0.8 %
Eosinophil #: 0.2 10*3/uL (ref 0.0–0.7)
Eosinophil %: 4.5 %
HCT: 30.7 % — AB (ref 40.0–52.0)
HGB: 9.6 g/dL — ABNORMAL LOW (ref 13.0–18.0)
LYMPHS ABS: 0.7 10*3/uL — AB (ref 1.0–3.6)
Lymphocyte %: 17.1 %
MCH: 24.1 pg — ABNORMAL LOW (ref 26.0–34.0)
MCHC: 31.3 g/dL — AB (ref 32.0–36.0)
MCV: 77 fL — AB (ref 80–100)
MONO ABS: 0.5 x10 3/mm (ref 0.2–1.0)
MONOS PCT: 11.5 %
NEUTROS ABS: 2.8 10*3/uL (ref 1.4–6.5)
Neutrophil %: 66.1 %
Platelet: 156 10*3/uL (ref 150–440)
RBC: 3.98 10*6/uL — ABNORMAL LOW (ref 4.40–5.90)
RDW: 18.8 % — AB (ref 11.5–14.5)
WBC: 4.2 10*3/uL (ref 3.8–10.6)

## 2014-07-30 ENCOUNTER — Emergency Department: Payer: Self-pay | Admitting: Emergency Medicine

## 2014-07-30 ENCOUNTER — Ambulatory Visit: Payer: Self-pay | Admitting: Oncology

## 2014-07-30 LAB — COMPREHENSIVE METABOLIC PANEL
ALBUMIN: 2.8 g/dL — AB (ref 3.4–5.0)
ALT: 16 U/L (ref 14–63)
Alkaline Phosphatase: 80 U/L (ref 46–116)
Anion Gap: 6 — ABNORMAL LOW (ref 7–16)
BILIRUBIN TOTAL: 0.5 mg/dL (ref 0.2–1.0)
BUN: 19 mg/dL — ABNORMAL HIGH (ref 7–18)
Calcium, Total: 9.1 mg/dL (ref 8.5–10.1)
Chloride: 106 mmol/L (ref 98–107)
Co2: 28 mmol/L (ref 21–32)
Creatinine: 1.02 mg/dL (ref 0.60–1.30)
EGFR (African American): >60
GLUCOSE: 174 mg/dL — AB (ref 65–99)
OSMOLALITY: 286 (ref 275–301)
Potassium: 4.2 mmol/L (ref 3.5–5.1)
SGOT(AST): 32 U/L (ref 15–37)
SODIUM: 140 mmol/L (ref 136–145)
Total Protein: 6.7 g/dL (ref 6.4–8.2)

## 2014-07-30 LAB — URINALYSIS, COMPLETE
Bacteria: NONE SEEN
Bilirubin,UR: NEGATIVE
Glucose,UR: 50 mg/dL (ref 0–75)
Ketone: NEGATIVE
NITRITE: NEGATIVE
Ph: 8 (ref 4.5–8.0)
Protein: 100
Specific Gravity: 1.012 (ref 1.003–1.030)
Squamous Epithelial: NONE SEEN

## 2014-07-30 LAB — CBC
HCT: 28.1 % — AB (ref 40.0–52.0)
HGB: 8.7 g/dL — ABNORMAL LOW (ref 13.0–18.0)
MCH: 23.1 pg — AB (ref 26.0–34.0)
MCHC: 31 g/dL — AB (ref 32.0–36.0)
MCV: 75 fL — AB (ref 80–100)
Platelet: 213 10*3/uL (ref 150–440)
RBC: 3.76 10*6/uL — AB (ref 4.40–5.90)
RDW: 18.3 % — AB (ref 11.5–14.5)
WBC: 6.3 10*3/uL (ref 3.8–10.6)

## 2014-08-01 LAB — URINE CULTURE

## 2014-08-17 ENCOUNTER — Emergency Department: Payer: Self-pay | Admitting: Emergency Medicine

## 2014-08-22 ENCOUNTER — Emergency Department: Payer: Self-pay | Admitting: Emergency Medicine

## 2014-08-28 ENCOUNTER — Ambulatory Visit
Admit: 2014-08-28 | Disposition: A | Discharge status: Home / Self Care | Payer: Self-pay | Attending: Oncology | Admitting: Oncology

## 2014-08-29 ENCOUNTER — Emergency Department: Payer: Self-pay | Admitting: Emergency Medicine
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
## 2014-10-20 NOTE — Discharge Summary (Signed)
PATIENT NAME:  Warren Taylor, Warren Taylor MR#:  [id number] DATE OF BIRTH:  12/13/1927  DATE OF ADMISSION:  11/14/2013 DATE OF DISCHARGE:  11/16/2013  DISCHARGE DIAGNOSES: 1.  Pneumothorax which developed after CT-guided biopsy of the lung, resolved. 2.  Hypertension.  3.  Hyperlipidemia.  4.  Diabetes mellitus type 2.   DISCHARGE MEDICATIONS: 1.  Lisinopril 20 mg p.o. daily.  2.  Lantus 23 units daily.  3.  Metoprolol tartrate 25 mg p.o. b.i.d.  4.  Kay Ciel 20 mEq p.o. daily.   DIET:  Low sodium, ADA diet.   CONSULTATIONS:  Dr. Thelma Bargeaks and [facility]  FOLLOWUP:  The patient is to follow up with Dr. Orlie DakinFinnegan for his lung cancer. The patient's biopsy of the lung that was done on the 19th showed lung cancer, and the patient will follow up with him regarding treatment of the cancer.   HOSPITAL COURSE:  This patient is an 81 year old male patient with history of lung mass. The patient had a pneumothorax after CT-guided biopsy and admitted to medicine for pneumothorax. The patient not hypoxic and we monitored him and Dr.  Thelma Bargeaks followed the patient and the patient's pneumothorax resolved and he got the chest tube pulled out yesterday. The patient will follow up with Dr. Orlie DakinFinnegan for radiation therapy, chemotherapy and Port-A-Cath placement, and his pneumothorax resolved. Dr. Thelma Bargeaks followed x-rays  and pulled out the chest tube, and chest x-ray yesterday after the removal of the chest tube showed no pneumothorax and mass in the left upper lobe. The patient also has history of questionable seizure and he was discharged recently, on February 18th, and at that time he was discharged with Neurontin 300 mg p.o. t.i.d., so I advised him to continue that.   The patient was stable at the time of discharge.  PHYSICAL EXAMINATION:  CARDIOVASCULAR:  S1, S2  regular. LUNGS: Clear.   The patient went home in stable condition and he will see Dr. Orlie DakinFinnegan for his cancer treatment options.   TIME SPENT ON  DISCHARGE PREPARATION: More than 30 minutes.   ____________________________ Katha HammingSnehalatha Shakiah Wester, MD sk:dmm D: 11/17/2013 14:32:00 ET T: 11/17/2013 22:13:34 ET JOB#: [id number]  cc: Katha HammingSnehalatha Cassandre Oleksy, MD, <Dictator> Katha HammingSNEHALATHA Sinan Tuch MD ELECTRONICALLY SIGNED 11/23/2013 12:04

## 2014-10-20 NOTE — H&P (Signed)
PATIENT NAME:  Warren Taylor, Warren Taylor MR#:  [id number] DATE OF BIRTH:  1927-10-21  DATE OF ADMISSION:  11/14/2013  FAMILY PHYSICIAN: Dennison Mascot, MD  ONCOLOGIST: Tollie Pizza. Orlie Dakin, MD  REASON FOR ADMISSION: Pneumothorax.   HISTORY OF PRESENT ILLNESS: An 81 year old male with history of lung mass, status post bronchoscopy and biopsy 2 times with unyielding results. Had a CT-guided biopsy today. The patient's CT-guided biopsy was done by Interventional radiologist . The patient developed pneumothorax after the procedure and had a chest tube placed, and Dr. Orlie Dakin requested [place] to admit the patient and consult Dr. Thelma Barge and also IR for followup of pneumothorax. The patient is asymptomatic. Denies any complaints. Not having any trouble breathing, not hypoxic. The patient's O2 sats are 96% on room air.   PAST MEDICAL HISTORY: Significant for history of diabetes, hypertension, admitted from February 15th to 18th after he had altered mental status, found to have possible seizure. The patient also has history of hyperlipidemia and hypertension.   MEDICATIONS AT HOME:  1.  Simvastatin 40 mg p.o. daily. 2.  Acetaminophen with oxycodone 5/325 mg every 8 hours.  3.  Lisinopril 20 mg daily. 4.  Levemir 23 units once a day.  5.  Neurontin 300 mg p.o. t.i.d.  6.  Metoprolol 25 mg p.o. b.i.d.  7.  KCl 20 mEq p.o. daily.  8.  Flexeril 5 mg every 8 hours as needed for muscle spasms.   ALLERGIES: No known allergies.   SOCIAL HISTORY: History of smoking before, 15 years ago. The patient smoked about 1-1/2 packs daily. The patient uses occasional alcohol. No drugs.   FAMILY HISTORY: No history of strokes or cardiovascular disease.   PAST SURGICAL HISTORY: History of chest tube placement at this time. Denies any previous surgeries.   REVIEW OF SYSTEMS:   CONSTITUTIONAL: No fever. No fatigue. No weakness.  EYES: No blurred vision. No inflammation.  EARS, NOSE, THROAT: No tinnitus. No ear pain. No hearing  loss.  RESPIRATORY: No cough. No wheezing. No trouble breathing.  CARDIOVASCULAR: No chest pain. No orthopnea. No PND.  GASTROINTESTINAL: No nausea. No vomiting. No abdominal pain.  GENITOURINARY: No dysuria or hematuria.  ENDOCRINE: No polyuria or nocturia.  HEMATOLOGIC: No anemia.  INTEGUMENTARY: No skin rash.  MUSCULOSKELETAL: No joint pain.  NEUROLOGIC: No numbness or weakness. The patient has a history of seizures.  PSYCHIATRIC: No anxiety or insomnia.   PHYSICAL EXAMINATION: VITAL SIGNS: Temperature 97.8, heart rate 71, blood pressure 113/79, sats 98% on room air.  GENERAL: Alert, awake, oriented, elderly male, not in distress, answering questions appropriately.  HEAD: Atraumatic, normocephalic.  EYES: Pupils equal, reacting to light. Extraocular movements intact.  EARS, NOSE, THROAT: No tympanic membrane congestion. No turbinate hypertrophy. No oropharyngeal erythema. NECK: Supple. No JVD. No carotid bruit.  CARDIOVASCULAR: S1, S2 regular. No murmurs. The patient's PMI is nondisplaced. Peripheral pulses are intact.  LUNGS: The patient has a left-sided chest tube present. Lung sounds are clear. No wheezing.  ABDOMEN: Soft, nontender, nondistended. Bowel sounds present. No organomegaly.  MUSCULOSKELETAL: The patient is able to move all extremities x4. No cyanosis, no clubbing.  SKIN: No skin rashes.  NEUROLOGIC: Cranial nerves II through XII are intact. Power 5/5 in upper and lower extremities. Sensation intact. DTRs 2+ bilaterally.  LABORATORY DATA: CT-guided biopsy was done this morning for left upper lobe pulmonary mass. The patient developed a tiny pneumothorax after 10 minutes, and after 1-hour followup, the patient's chest x-ray showed large left-sided pneumothorax. The patient received another  x-ray after the chest tube placement. The patient's PET scan done on April 2 showed hypermetabolic left upper lobe mass, consistent with primary bronchogenic carcinoma, evidence of  metastasis in the same lobe, and also has left and right lower paratracheal lymph nodes  metastasis.   ASSESSMENT AND PLAN: The patient is an 81 year old male patient with:  1.  Left upper lobe mass, status post a CT-guided biopsy, developed pneumothorax. The patient received chest tube on the left side. The patient will be admitted to medical service. Will consult interventional radiology and also Dr. Thelma Bargeaks for followup of pneumothorax. I spoke to Dr. Marland Kitchen(Dictation Anomaly) <<MISSING TEXT>>, who did the chest tube placement, and he mentioned that interventional radiology will follow up with the pneumothorax on a daily basis and make recommendations.  2.  History of seizures. The patient had possible metastases by February MRI. The patient is on Neurontin 300 mg t.i.d.. Continue that.  3.  The patient's other diagnoses include hypertension, hyperlipidemia, and diabetes mellitus type 2. The patient will be on lisinopril, simvastatin, and regarding diabetes he will be on sliding scale with coverage and resume Levemir from tomorrow. 4.   Gastrointestinal prophylaxis with proton pump inhibitors. 5.  The patient will get chest x-ray tomorrow for followup of pneumothorax and consult Dr. Orlie DakinFinnegan from oncology regarding left upper lobe mass.   TIME SPENT ON HISTORY AND PHYSICAL: 60 minutes.    ____________________________ Katha HammingSnehalatha Temisha Murley, MD sk:jcm D: 11/14/2013 14:31:20 ET T: 11/14/2013 15:01:36 ET JOB#: [id number]  cc: Katha HammingSnehalatha Bricelyn Freestone, MD, <Dictator>

## 2014-10-20 NOTE — Op Note (Signed)
PATIENT NAME:  [name], Brevan J MR#:  [id number] DATE OF BIRTH:  May 14, 1928  DATE OF PROCEDURE:  11/29/2013  SURGEON: Marcial Pacasimothy E. Thelma Bargeaks, M.D.   ASSISTANT: Salome Holmeshris Lundquist, M.D.   PREOPERATIVE DIAGNOSIS:  Lung cancer.   POSTOPERATIVE DIAGNOSIS:  Lung cancer (need for intravenous access for chemotherapy).   INDICATIONS FOR PROCEDURE:  Mr. Morey Hummingbirdugene Murton is an 81 year old African American male with a recent diagnosis of adenocarcinoma of the lung who requires a Port-A-Cath for chemotherapy. The indications and risks were explained to the patient who gave informed consent.   DESCRIPTION OF PROCEDURE: The patient was brought to the operating suite and placed in the supine position. The patient underwent laryngeal mask airway. The right side of his neck was then interrogated with the ultrasound probe. The right internal jugular vein appeared to be immediately available for cannulation. The patient was then prepped and draped in the usual sterile fashion. We, again, used the ultrasound probe to identify the right internal jugular vein, which was then percutaneously catheterized using a long needle. A wire was placed into the right side of the heart under fluoroscopic guidance. We then turned our attention to the anterior chest wall where a port site was selected, and the port was created by making an incision and carrying this down to the prepectoral fascia. A port was made and the catheter was then placed through a subcutaneous tunnel from the port site up to the neck incision. The catheter was brought up through this and then placed through a peel-away sheath over a dilator into the right side of the heart. Under fluoroscopic guidance, the catheter was pulled back to the superior vena caval right atrial junction. The catheter was then carefully assembled and it was secured to the prepectoral fascia with 3 interrupted 2-0 Prolene sutures. The wounds were hemostatic and they were closed with running Vicryl on the  subcutaneous tissues and nylon on the skin. The single puncture site at the neck was closed with a nylon. The patient tolerated the procedure well and was awakened and then taken to the recovery room in stable condition.     ____________________________ Sheppard Plumberimothy E. Thelma Bargeaks, MD teo:dmm D: 11/29/2013 16:50:37 ET T: 11/29/2013 19:14:23 ET JOB#: [id number]  cc: Marcial Pacasimothy E. Thelma Bargeaks, MD, <Dictator> Jasmine DecemberIMOTHY E Quincie Haroon MD ELECTRONICALLY SIGNED 12/02/2013 19:54

## 2014-10-20 NOTE — Consult Note (Signed)
History of Present Illness:  Reason for Consult Lung cancer, pneumothorax   HPI   Patient recently admitted to the hospital with pneumothorax after a CT-guided lung biopsy. Results confirm adenocarcinoma. Currently he feels well and is asymptomatic. He has no neurologic complaints. He denies any chest pain, shortness of breath, cough, or hemoptysis. He denies any fevers. He is good appetite and denies weight loss. He denies any nausea, vomiting, constipation, or diarrhea. Patient feels at his baseline and offers no specific complaints today.  PFSH:  Additional Past Medical and Surgical History hypertension, hyperlipidemia, diabetes, CVA.  Family history: Negative and noncontributory.  Social history: Previous heavy tobacco use, quit about 15 years ago. Although patient reports today he is a never smoker.  Occasional alcohol.   Review of Systems:  Performance Status (ECOG) 0   Review of Systems   As per HPI. Otherwise, 10 point system review was negative.   NURSING NOTES: **Vital Signs.:   20-May-15 13:46   Vital Signs Type: Routine   Temperature Temperature (F): 97.9   Celsius: 36.6   Temperature Source: oral   Pulse Pulse: 74   Respirations Respirations: 20   Systolic BP Systolic BP: 233   Diastolic BP (mmHg) Diastolic BP (mmHg): 69   Mean BP: 84   Pulse Ox % Pulse Ox %: 100   Pulse Ox Activity Level: At rest   Oxygen Delivery: Room Air/ 21 %   Physical Exam:  Physical Exam General: Well-developed, well-nourished, no acute distress. Eyes: Pink conjunctiva, anicteric sclera. HEENT: Normocephalic, moist mucous membranes, clear oropharnyx. Lungs: Clear to auscultation bilaterally, chest tube noted in left chest wall. Heart: Regular rate and rhythm. No rubs, murmurs, or gallops. Abdomen: Soft, nontender, nondistended. No organomegaly noted, normoactive bowel sounds. Musculoskeletal: No edema, cyanosis, or clubbing. Neuro: Alert, answering all questions  appropriately. Cranial nerves grossly intact. Skin: No rashes or petechiae noted. Psych: Normal affect.    No Known Allergies:     insulin detemir 100 units/mL subcutaneous solution: 23  subcutaneous once a day, Status: Active, Quantity: 0, Refills: None   metoprolol tartrate 25 mg oral tablet: 1 tab(s) orally 2 times a day, Status: Active, Quantity: 60, Refills: None   potassium chloride 20 mEq oral tablet, extended release: 1 tab(s) orally once a day, Status: Active, Quantity: 30, Refills: None   lisinopril 20 mg oral tablet: 1 tab(s) orally once a day, Status: Active, Quantity: 30, Refills: None  Laboratory Results: Routine Chem:  20-May-15 05:44   Glucose, Serum  141  BUN 17  Creatinine (comp) 1.30  Sodium, Serum 138  Potassium, Serum 4.1  Chloride, Serum  108  CO2, Serum 23  Calcium (Total), Serum  8.4  Anion Gap 7  Osmolality (calc) 280  eGFR (African American)  58  eGFR (Non-African American)  50 (eGFR values <26mL/min/1.73 m2 may be an indication of chronic kidney disease (CKD). Calculated eGFR is useful in patients with stable renal function. The eGFR calculation will not be reliable in acutely ill patients when serum creatinine is changing rapidly. It is not useful in  patients on dialysis. The eGFR calculation may not be applicable to patients at the low and high extremes of body sizes, pregnant women, and vegetarians.)  Routine Hem:  20-May-15 05:44   WBC (CBC) 5.4  RBC (CBC) 4.54  Hemoglobin (CBC)  11.2  Hematocrit (CBC)  35.2  Platelet Count (CBC)  133  MCV  78  MCH  24.7  MCHC  31.8  RDW  17.9  Neutrophil %  59.1  Lymphocyte % 31.0  Monocyte % 7.6  Eosinophil % 1.8  Basophil % 0.5  Neutrophil # 3.2  Lymphocyte # 1.7  Monocyte # 0.4  Eosinophil # 0.1  Basophil # 0.0 (Result(s) reported on 15 Nov 2013 at 06:53AM.)   Assessment and Plan: Impression:   Clinical stage III adenocarcinoma of the lung, pneumothorax. Plan:   1. Lung cancer: Biopsy  confirms the results. Patient has expressed interest in pursuing treatment once he recovers from his pneumothorax. We will likely give concurrent chemotherapy and radiation therapy, but given his advanced age will not give any consolidation therapy. Return to clinic in 1 week for further treatment planning. Patient will require port placement prior to initiating treatment.Pneumothorax: Case discussed with Dr. Genevive Bi and plan is to remove chest tube tomorrow.Brain lesions: Unclear etiology, but without distinct mass or vasogenic edema and would be unusual for these to be metastasis.  Repeat MRI in the next 1-2 weeks.Adrenal lesion: Adrenal gland was slightly enlarged on CT scan.  Monitor. consult, call questions.  Electronic Signatures: Delight Hoh (MD)  (Signed [phone] 15:28)  Authored: HISTORY OF PRESENT ILLNESS, PFSH, ROS, NURSING NOTES, PE, ALLERGIES, HOME MEDICATIONS, LABS, ASSESSMENT AND PLAN   Last Updated: 20-May-15 15:28 by Delight Hoh (MD)
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
## 2014-10-28 NOTE — H&P (Signed)
PATIENT NAME:  Warren Taylor, Warren Taylor MR#:  [id number] DATE OF BIRTH:  Oct 04, 1927  DATE OF ADMISSION:  07/08/2014  PRIMARY CARE PHYSICIAN: Dennison MascotLemont Morrisey, MD  REFERRING PHYSICIAN: Sheryl L. Mindi JunkerGottlieb, MD  CHIEF COMPLAINT: Hypoglycemia.   HISTORY OF PRESENT ILLNESS: [name] is an 81 year old male with history of lung cancer, metastases to the brain, diabetes mellitus, insulin-dependent. Called EMS 2 times today for low blood sugars of 18. When EMS arrived, the patient was unresponsive with pinpoint pupils. The patient was given 2 amps of D50 and Narcan with improvement of the mental status. Concerning this, the patient was brought to the Emergency Department. Workup in the Emergency Department: The patient was not found to have any obvious signs of infection. The patient had multiple episodes of hypoglycemia in the Emergency Department. The patient's blood sugar improved to 144. Again, the plan was to discharge the patient, and again, found to have blood sugar of 18. Unable to obtain any history from the patient as the patient is somewhat confused. Could not tell the amount of insulin he administers to himself.   PAST MEDICAL HISTORY:  1.  Stage IV lung cancer, metastases to the brain, currently on hospice.  2.  Hypertension.  3.  Hyperlipidemia.  4.  Diabetes mellitus, insulin-dependent.  5.  Cerebrovascular accident.   ALLERGIES: No known drug allergies.   HOME MEDICATIONS:  1.  Nystatin 100,000 units topical.  2.  Lisinopril 20 mg daily.  3.  Levemir 23 units subcutaneous daily.  4.  Klor-Con 20 mEq daily.  5.  Alprazolam 0.25 mg 2 times a day.   SOCIAL HISTORY: Previous history of smoking, quit 40 years' back. No history of alcohol or drug use.   FAMILY HISTORY: Could not be obtained from the patient secondary to altered mental status or possible dementia.   REVIEW OF SYSTEMS: Could not be obtained from the patient secondary to altered mental status or possible dementia.   PHYSICAL  EXAMINATION:  GENERAL: This is a well-built, well-nourished, age-appropriate male lying down in the bed, not in distress.  VITAL SIGNS: Temperature 97.6, pulse 81, blood pressure 129/69, respiratory rate of 18, oxygen saturation is 96% on room air.  HEENT: Head normocephalic, atraumatic. There is no scleral icterus. Conjunctivae are normal. Pupils are equal and reactive. Mucous membranes are moist. No pharyngeal erythema.  NECK: Supple. No lymphadenopathy. No JVD. No carotid bruit. No thyromegaly.  CHEST: Has no focal tenderness. Decreased breath sounds in the lower lobes.  HEART: S1, S2 regular. No murmurs are heard.  ABDOMEN: Bowel sounds present. Soft, nontender, nondistended.  EXTREMITIES: No pedal edema. Pulses 2+.  NEUROLOGIC: The patient is oriented to self. Could not tell the place or time. Cranial nerves II through XII intact. Motor 5/5 in upper and lower extremities.   LABORATORY DATA: CBC: WBC of 4.7, hemoglobin 9.7, platelet count 130,000. BMP: Glucose 249. The rest of the values are within normal limits.   ASSESSMENT AND PLAN:  1.  Hypoglycemia, recurrent: The patient seems to be extremely unsafe to administer insulin for  himself. The patient does not know how much insulin he is supposed to take. I strongly consider getting a Child psychotherapistsocial worker consultation for medication administration. Keep the patient to a medical bed. Continue the D5. Continue to monitor blood glucose levels.  2.  Lung cancer, metastatic to the brain: The decision was made that no further treatment was required; currently on hospice care.  3.  Hypertension: Continue the lisinopril.  4.  Deep vein  thrombosis prophylaxis with Lovenox.   TIME SPENT: 50 minutes.    ____________________________ Susa Griffins, MD pv:ts D: 07/08/2014 23:45:00 ET T: 07/09/2014 00:10:30 ET JOB#: [id number]  cc: Susa Griffins, MD, <Dictator> Susa Griffins MD ELECTRONICALLY SIGNED 07/20/2014 21:25

## 2014-10-28 NOTE — Consult Note (Signed)
PATIENT NAME:  Warren Taylor, Warren Taylor MR#:  [id number] DATE OF BIRTH:  1927-10-17  DATE OF CONSULTATION:  08/22/2014  REFERRING PHYSICIAN: Emergency Department.  CONSULTING PHYSICIAN:  Warren Gladden, MD  REASON FOR CONSULTATION: Difficult Foley catheter.   HISTORY OF PRESENT ILLNESS: This is an 81 year old African American male with stage IV metastatic lung cancer and BPH who has had a Foley catheter placed for at least approximately 2 months for urinary retention and gross hematuria. He has had difficulty with Foley catheter placements in the past and recently had a Foley catheter placed on 08/14/2014 by Dr. Domenick Bookbinder. He presented again today to the Emergency Room after his Foley catheter had fallen out. The catheter was, in fact, still attached to his leg and completely transected with the distal tip and the balloon port missing. Ultimately, they were able to find the distal tip at home and the patient finally did admit to cutting the catheter off himself but there is no concern for retained portion of the distal catheter. The Emergency Room tried to place a Foley catheter with a 16 French x 2 and ultimately were unable to do so. The ER doctor then attempted to place the 22 French catheter and felt that the Foley was likely in the bladder and blew up the balloon. The catheter was able to be irrigated into the catheter but not aspirated back concerning for malposition catheter. Urology was consulted to help with difficult Foley.  PAST MEDICAL HISTORY: 1.  Stage IV lung cancer, metastatic to the brain currently in hospice.  2.  Hypertension.  3.  Hyperlipidemia.  4.  Diabetes, insulin-dependent.  5.  CVA.  6.  BPH with gross hematuria.   PAST SURGICAL HISTORY: History of chest tube placement. Denies any other previous surgeries.   ALLERGIES: No known drug allergies.   HOME MEDICATIONS:  1.  Nystatin 1000 units topical.  2.  Lisinopril 20 mg.  3.  Levemir 23 units subcutaneously daily.  4.   Klor-Con 20 mEq daily.  5.  Alprazolam 0.5 mg b.i.d.   SOCIAL HISTORY: Previous history of smoking 15 years ago, a pack and a half day. No alcohol or illicit drug use.   FAMILY HISTORY: No history of strokes or cardiovascular disease.  REVIEW OF SYSTEMS:  The patient is severely demented and has altered mental status; therefore, was unable to provide an adequate review of systems.   PHYSICAL EXAMINATION:  VITAL SIGNS: Stable. Afebrile.  GENERAL: Alert and interactive but not oriented x 3.  HEENT: Normocephalic, atraumatic. Moist mucous membranes. Extraocular movements intact.  NECK: Supple. Trachea is midline. No palpable masses.  LUNGS: No increased work of breathing or labored respirations.  CARDIOVASCULAR: No clubbing, cyanosis, or edema.  ABDOMEN: Soft, nontender, nondistended. No rebound or guarding. Bladder appears to be mildly distended.  GENITOURINARY: Uncircumcised phallus. Easily retractable foreskin. Testicles descended bilaterally with no scrotal edema. A 22 French Foley catheter is in place draining frank blood. The balloon was deflated and catheter was removed  SKIN: No rashes or lesions. No bruises.  NEUROLOGIC: No focal deficits. Cranial nerves grossly intact.  PSYCHIATRIC: Normal affect. No depression.  LABORATORY AND RADIOGRAPHIC DATA: No laboratory data or imaging available for review.   ASSESSMENT: This is an 81 year old male with urinary tension and difficult Foley catheter. His previous Foley catheter was removed and replaced without difficulty using a 20 French Coude Foley catheter, which was easily advanced into the bladder. The bladder and then drained a good amount of blood-tinged urine,  greater than 300 mL. The patient tolerated the procedure without any complications.   PLAN: I had an extensive conversation with the patient's granddaughter who is now taking over his care. She reports that he was referred to a urologist on several occasions by his PCP, Dr.  Thana AtesMorrisey; however, missed many of these appointments because his son was unable to bring him. Now that she was taken over care, she will ensure that he follows up with me [facility].   TIME SPENT: I spent 45 minutes with the patient counseling him.   Thank you for involving me in this patient's care.   ____________________________ Warren GladdenAshley J. Barret Esquivel, MD ajb:bm D: 08/22/2014 19:50:14 ET T: 08/23/2014 01:23:27 ET JOB#: [id number]  cc: Warren GladdenAshley J. Malina Geers, MD, <Dictator> Warren GladdenASHLEY J Opie Fanton MD ELECTRONICALLY SIGNED 09/05/2014 13:41

## 2014-10-28 NOTE — Discharge Summary (Signed)
PATIENT NAME:  Warren Taylor, Warren Taylor MR#:  [id number] DATE OF BIRTH:  1928/06/27  DATE OF ADMISSION:  07/08/2014 DATE OF DISCHARGE:  07/10/2014  ADMITTING DIAGNOSIS: Hypoglycemia and altered mental status due to hypoglycemia.   DISCHARGE DIAGNOSES:  1.  Hypoglycemia in diabetic patient.  2.  Metabolic encephalopathy due to hypoglycemia, resolved on D10 water solution.  3.  Failure to thrive.  4.  Hypokalemia.  5.  Dysuria, suspected urinary tract infection, cultures are pending.  6.  Urinary retention, now with Foley catheter.  7.  Mild hematuria after Foley catheter was placed.  8.  Anxiety.  9.  History of dementia.  10.  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  11.  Hypertension, hyperlipidemia, diabetes mellitus, and history of stroke.   DISCHARGE CONDITION: Stable.   DISCHARGE MEDICATIONS: The patient is to continue lisinopril 10 mg p.o. daily, Flomax 0.4 mg p.o. daily, finasteride 5 mg p.o. daily, alprazolam 0.25 mg twice daily as needed, magnesium oxide 400 mg p.o. twice daily, Ensure 240 mg p.o. once daily, levofloxacin 250 mg p.o. once daily for 5 days. The patient is not to take potassium supplements, Levemir, or metoprolol at this time.   HOME OXYGEN: None.   DIET: 2 grams salt, low-fat, low-cholesterol, carbohydrate -controlled diet.   DIETARY SUPPLEMENTS: Ensure twice daily.   DIET CONSISTENCY: Mechanical, soft.   ACTIVITY LIMITATIONS: As tolerated.   REFERRAL: To hospice at home.    FOLLOWUP APPOINTMENT: With Dr. Thana Ates in 2 days after discharge. The patient is to withdrawal Foley catheter at Dr. Clance Boll office.   HOSPITAL COURSE: The patient is an 81 year old African American male with a history of stage IV lung carcinoma with metastasis to brain who presents to the hospital with hypoglycemia. Apparently, EMS was called at the patient's home since his blood glucose level was checked and was found to be only 18. He was found to be unresponsive with pinpoint  pupils. He was given D50 two amps as well as Narcan with improvement of his mental status. He was brought to Emergency Room and his sugars trended down still again. He was also found to be confused. On arrival to the Emergency Room, the patient's temperature was 97.6, pulse was 81, respiration rate was 18, blood pressure 129/69, saturation was 96% on room air. Physical exam unremarkable except for confusion. The patient's lab data done on arrival to the Emergency Room showed mild elevation of BUN to 22 and glucose was 249. The patient's comprehensive metabolic panel was otherwise unremarkable but albumin was low at 3.2. Cardiac enzymes first set was negative. White blood cell count was normal at 4.2, hemoglobin was 9.7, platelet count was 137,000 with MCV of 76. Urinalysis revealed 317 red blood cells and 9 white blood cells. EKG showed sinus rhythm at 85 beats per minute, premature supraventricular complexes, left ventricular fascicular block, but no acute ST-T changes were noted.   RADIOLOGIC STUDIES: CT scan of head as well as cervical spine were performed without contrast in the Emergency Room on 07/08/2014 and showed no skull fracture or intracranial hemorrhage within the limits of detection noncontrast CT, no definite intra-axilla metastasis were noted. No cervical spine fracture or traumatic subluxation. Asymmetric enlargement of right thyroid gland was noted and evaluation of sonography was recommended.   FURTHER HOSPITAL COURSE: The patient was admitted to the hospital for further evaluation because of his hypoglycemia.   Recurrent hypoglycemia: He was initiated on D5 water solution; however, he required D5 water to be advanced to  D10 water to maintain his glucose levels. He was managed on D10 for approximately a day and then D10 water solution was slowly weaned off. He was noted to have urinary retention and had to have a Foley catheter placed on 07/09/2014 resulting in mild trauma and mild  hematuria. It was felt the patient had urinary tract infection and antibiotic therapy was initiated after the patient's urine cultures were taken; however, urine cultures were not available at the time of dictation. In regard to hypoglycemia, it was felt the patient's hypoglycemia very likely was due to his poor p.o. intake as well as long-acting insulin, Levemir. It was felt that the patient should discontinue his Levemir, and continue just diabetic diet. Depending on his oral intake, Levemir could be restarted again. In regard to metabolic encephalopathy, it was felt to be due to hypoglycemia and it resolved resolution of his hyperglycemia. For failure to thrive, the patient was advised to continue Ensure as he is able to tolerate it. The patient was noted to be hypokalemic in the hospital and potassium was supplemented. His potassium level normalized at the time of dictation. His magnesium level, unfortunately, was not checked until 07/10/2014; however, it was supplemented IV and was recommended to be continued as outpatient orally. In regard to urinary tract infection, suspected, patient, again, had urine cultures taken; however, cultures were still pending. The patient is to continue antibiotic therapy for suspected urinary tract infection. For urinary retention, it was felt that the patient's urine retention and was very likely related to UTI as well as possibly BPH, the patient was initiated on Flomax as well as finasteride. The patient is to continue Foley catheter for now until he is seen by his primary care physician, Dr. Thana AtesMorrisey, and have his Foley catheter withdrawn at Dr. Clance BollMorrisey's office in the next few days after discharge. For stage IV lung carcinoma with brain metastasis, the patient was advised to continue hospice at home. For history of hypertension, hyperlipidemia, stroke, the patient is to continue his outpatient medications, no changes were made.   The patient is being discharged in stable  condition with the above-mentioned medications and follow up. On the day of discharge, temperature was 98.4, pulse was 93, respiration rate was 20, blood pressure ranging from 85 systolic to 126 systolic and 60s to 70s diastolic, oxygen saturation was 99%-100% on room air at rest.   TIME SPENT: 30 minutes.    ____________________________ Warren Caperima Airis Barbee, MD rv:bm D: 07/10/2014 16:42:00 ET T: 07/11/2014 01:35:59 ET JOB#: [id number]  cc: Warren Caperima Jessejames Steelman, MD, <Dictator> Warren MascotLemont Morrisey, MD Romaldo Saville MD ELECTRONICALLY SIGNED 07/19/2014 9:55

## 2014-10-28 NOTE — Consult Note (Signed)
CONSULTING SERVICE: Emergency Department FOR CONSULTATION: Difficulty with foley catheter placement OF PRESENT ILLNESS: [name] is an 81 YO AA gentleman with BPH who has been in and out of the ED with urinary retention. Each time, he has had a foley catheter placed with f/u where he failed his TOV. He presents acutely with urinary retention. A 14Fr foley was placed by the ED staff but is not draining well. Urology was consulted. Blood tinged urine is seen from the foley. Patient is comfortable and able to converse with me and is accompanied by his granddaughter.   PAST MEDICAL HISTORY:  Stage IV lung cancer, metastases to the brain, currently on hospice.  Hypertension.  Hyperlipidemia.  Diabetes mellitus, insulin-dependent.  Cerebrovascular accident.  SURGICAL HISTORY: History of chest tube placement at this time. Denies any previous surgeries.  No known drug allergies.  MEDICATIONS:  Nystatin 100,000 units topical.  Lisinopril 20 mg daily.  Levemir 23 units subcutaneous daily.  Klor-Con 20 mEq daily.  Alprazolam 0.25 mg 2 times a day.   SOCIAL HISTORY: History of smoking before, 15 years ago. The patient smoked about 1-1/2 packs daily. The patient uses occasional alcohol. No drugs.  HISTORY: No history of strokes or cardiovascular disease.  OF SYSTEMS: Per HPI  EXAMINATION: mood & affect, accompanied by his granddaughterEOMI, PERRLrespirationsND, NT, no guarding/rebuondtesticles descended bilatearllywith blood tinged urine eminating from foley. pelvis slightly distended with palpable bladder. despite foley, bladder scan reveals > 300mL at bedside.  All stable none to review 81 YO AA gentleman presenting to the ED with urinary retention. 14Fr foley placed by ED staff but draining poorly. Blood tinged urine present.  Existing catheter removed. Afte rsterile prepping, and utilizing a lidojet, a 22Fr coude tipped foley was introduced into the bladder with ~36100mL return of mildly blood tinged (pink  lemonade) colored urine. Catheter was irrigated and approximately 25-3850mL was irrigated out. Bladder scan after irrigation was 0mL. Patient was able to tolerate the procedure without any complications.  Dispo per ED. Patient should f/u as an outpatient with [facility] V[facility] ScotlandAshley Brandon [facility]. Information provided to granddaughter and patient.  minutes spent with patient.  you for involving me with Mr. Bogdanski's care.  Domenick BookbinderKurpad, MDSurgery  Electronic Signatures: Angelina PihKurpad, Lamia Mariner R (MD)  (Signed on 20-Feb-16 12:17)  Authored  Last Updated: 20-Feb-16 12:17 by Angelina PihKurpad, Daion Ginsberg R (MD)

## 2014-11-01 ENCOUNTER — Telehealth: Payer: Self-pay | Admitting: *Deleted

## 2014-11-01 NOTE — Telephone Encounter (Signed)
Patient died at home peacefully 11/21/2014 @1710 

## 2014-11-08 ENCOUNTER — Telehealth: Payer: Self-pay | Admitting: *Deleted

## 2014-11-08 NOTE — Telephone Encounter (Signed)
Asking if Death Certificate is ready to be picked up

## 2014-11-28 DEATH — deceased

## 2015-01-15 IMAGING — CR DG CHEST 1V PORT
1 series · 1 of 1 positions shown · non-contrast
Comparison: Two-view chest x-ray 11/23/2013.

CLINICAL DATA: Pleural effusions. Status post Port-A-Cath
insertion.

EXAM:
PORTABLE CHEST - 1 VIEW

[ap]
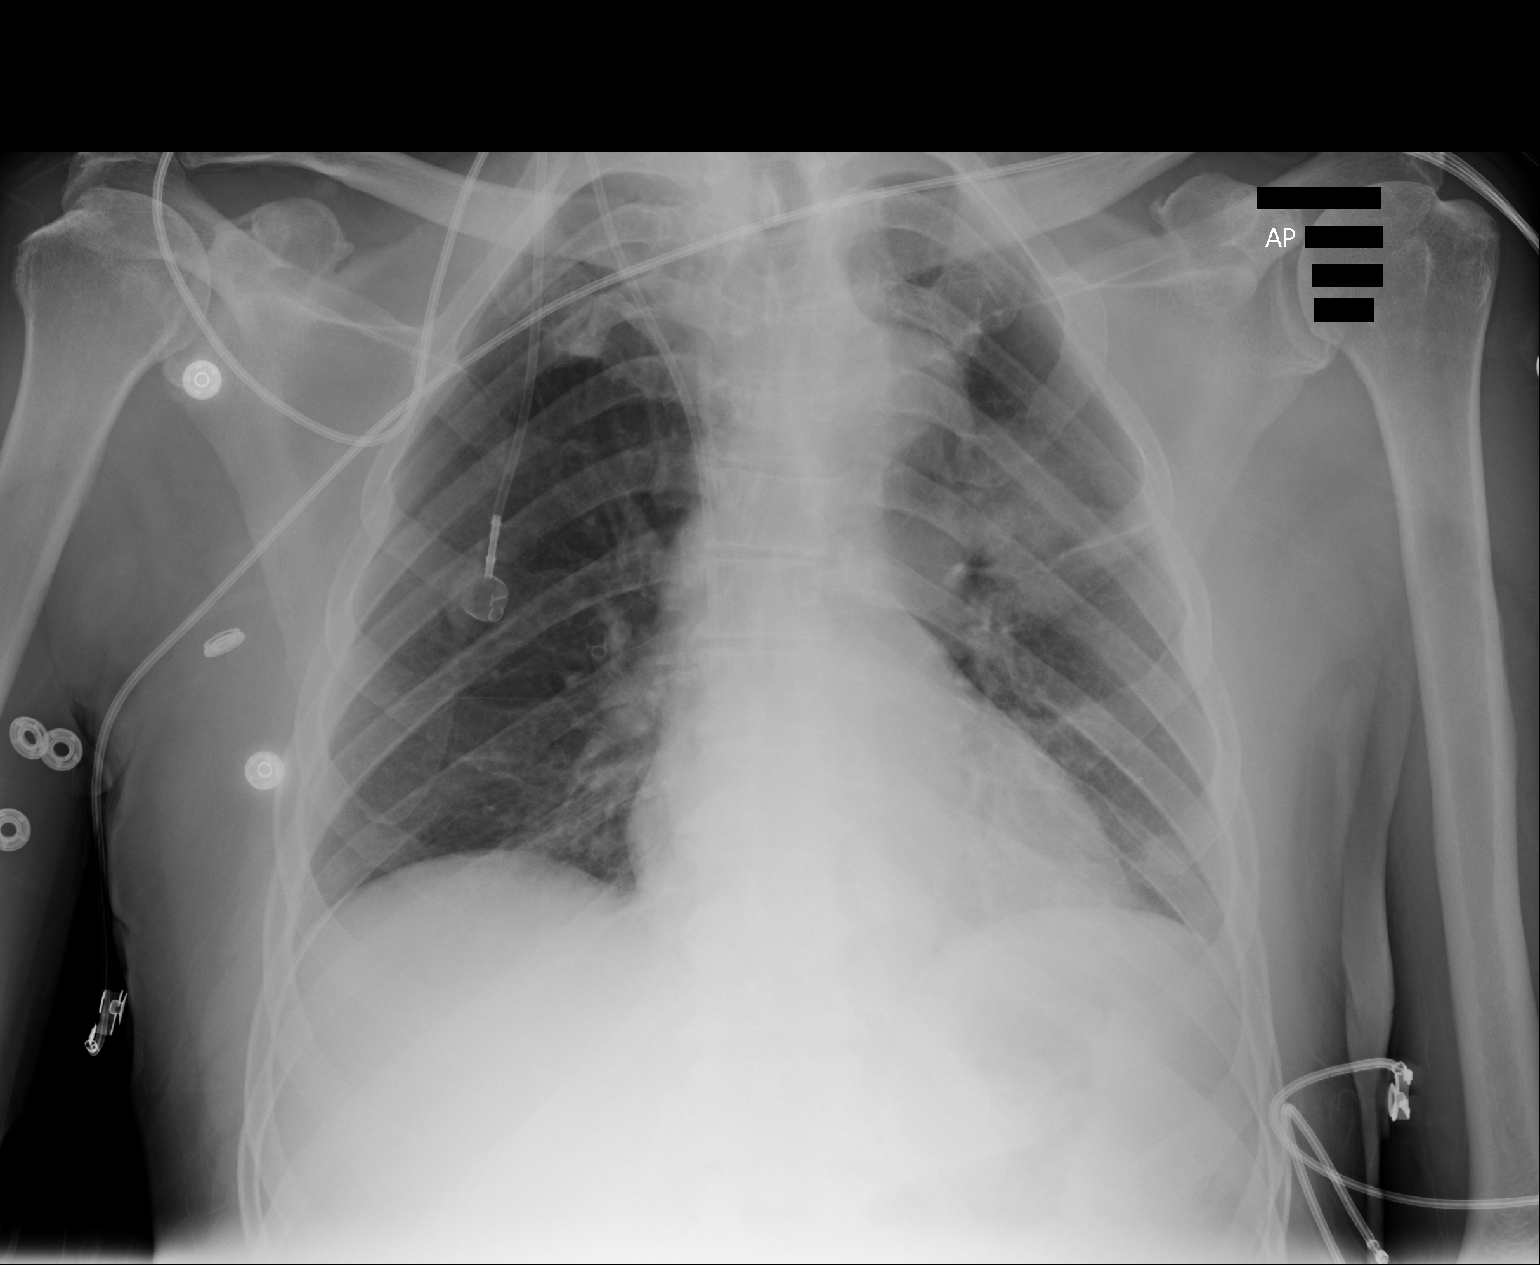

[1 of 1 positions shown; findings below may reference images not displayed]

FINDINGS: The heart size is normal. A new right IJ Port-A-Cath has been
placed. The tip is at the cavoatrial junction. There is no
pneumothorax. A left suprahilar mass is again noted. Additional left
lower lobe nodules are evident.
IMPRESSION: 1. Interval placement of right IJ Port-A-Cath without radiographic
evidence for complication.
2. Left perihilar mass with additional nodular metastases in the
left lower lobe.

## 2015-05-28 IMAGING — PT NM PET TUM IMG RESTAG (PS) SKULL BASE T - THIGH
1 of 10 series · 2 of 25 positions shown · non-contrast
Comparison: PET-CT 09/28/2013

CLINICAL DATA: Subsequent treatment strategy for left lung
carcinoma. Patient completed radiation therapy in on February 05, 2014. Brain metastasis.

EXAM:
NUCLEAR MEDICINE PET SKULL BASE TO THIGH
TECHNIQUE: 12.4 mCi F-18 FDG was injected intravenously. Full-ring PET imaging
was performed from the skull base to thigh after the radiotracer. CT
data was obtained and used for attenuation correction and anatomic
localization.
FASTING BLOOD GLUCOSE:  Value: 70 mg/dl

[Series 3: ct wb 5.0 b30f · axial · 5.0mm · 0.98mm/px · z∈[-1404,-538]mm · 2 of 290 slices shown]
[im 1/290  brain]
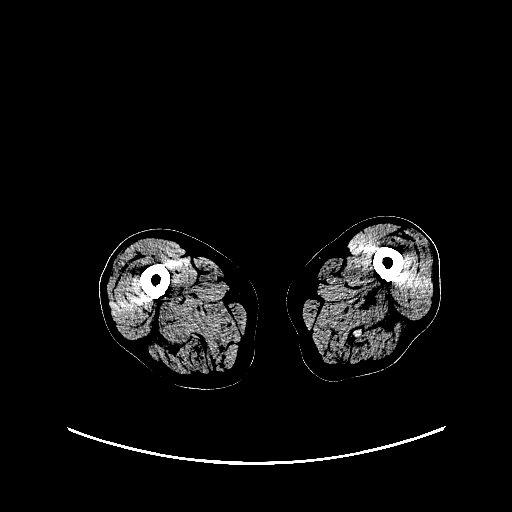
[im 290/290  brain]
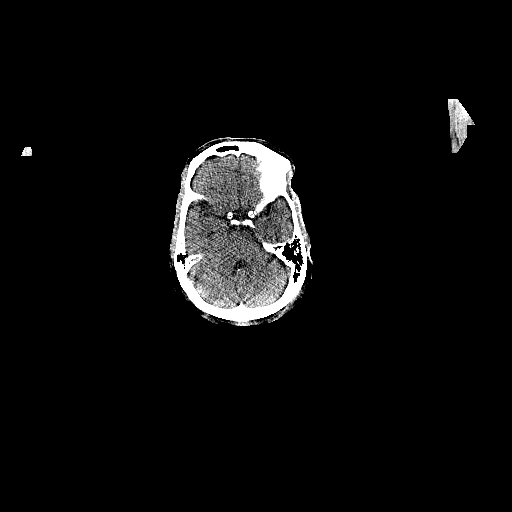

[2 of 25 positions shown; findings below may reference images not displayed]

FINDINGS: NECK

No hypermetabolic lymph nodes in the neck.

CHEST

The left upper lobe mass is decreased in size measuring 3.3 cm
decreased from 3.5 cm on prior. The mass is also decreased in
metabolic activity with SUV max = 7.5 decreased from 11.8. Mildly
hypermetabolic paratracheal lymph nodes are also decreased in
metabolic activity. No new parenchymal lung lesions.

ABDOMEN/PELVIS

There is new hypermetabolic activity associated with the left and
right adrenal glands. There is mild thickening perceptible on the CT
compared to prior but no measurable mass. Adrenal gland activity is
intense with SUV max equal 10.5 on the left is concerning for
metastasis.

There is again demonstrated focus of hypermetabolic activity
associated with the descending colon without clear mass lesion
(image 142). This activity is increased considerably with SUV max
equal 10.0 compared to 4.1 on prior.

SKELETON

There is a new focus of hypermetabolic activity within the right
humeral head associated with a small lytic lesion on the CT portion.
This activity is intense with hypermetabolic SUV max equal all 9.2.
This a additional lesion in the left iliac wing on image 211 with
SUV max equal 5.4.
IMPRESSION: 1. Interval decrease in size and metabolic activity of the left
upper lobe mass and mediastinal lymph nodes.
2. Unfortunately, there is new skeletal metastasis with
hypermetabolic lesions in the right humeral head and left iliac
wing.
3. Additionally, there is new hypermetabolic activity associated
with the left and right adrenal gland with minimal thickening. This
is highly concerning for adrenal metastasis.
4. Persistent hypermetabolic activity within the descending colon
which is increased in the interval. This is concerning for a
malignant or pre malignant lesion. Consider colonoscopy evaluation.
